# Patient Record
Sex: Male | Born: 2000 | Race: Black or African American | Hispanic: No | Marital: Single | Smoking: Never smoker
Health system: Southern US, Community
[De-identification: ages and names within clinical notes are randomized; demographics above are authoritative.]

## PROBLEM LIST (undated history)

## (undated) DIAGNOSIS — J45909 Unspecified asthma, uncomplicated: Secondary | ICD-10-CM

---
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
## 2022-05-26 ENCOUNTER — Encounter (HOSPITAL_BASED_OUTPATIENT_CLINIC_OR_DEPARTMENT_OTHER): Payer: Self-pay

## 2022-05-26 ENCOUNTER — Emergency Department (HOSPITAL_BASED_OUTPATIENT_CLINIC_OR_DEPARTMENT_OTHER)
Admission: EM | Admit: 2022-05-26 | Discharge: 2022-05-26 | Disposition: A | Discharge status: Home / Self Care | Payer: 59 | Attending: Emergency Medicine | Admitting: Emergency Medicine

## 2022-05-26 ENCOUNTER — Other Ambulatory Visit: Payer: Self-pay

## 2022-05-26 DIAGNOSIS — R519 Headache, unspecified: Secondary | ICD-10-CM | POA: Diagnosis present

## 2022-05-26 DIAGNOSIS — J45909 Unspecified asthma, uncomplicated: Secondary | ICD-10-CM | POA: Diagnosis not present

## 2022-05-26 MED ORDER — IBUPROFEN 800 MG PO TABS
800.0000 mg | ORAL_TABLET | Freq: Once | ORAL | Status: AC
  Administered 2022-05-26: 800 mg via ORAL
  Filled 2022-05-26: qty 1

## 2022-05-26 MED ORDER — ACETAMINOPHEN 500 MG PO TABS
1000.0000 mg | ORAL_TABLET | Freq: Once | ORAL | Status: AC
  Administered 2022-05-26: 1000 mg via ORAL
  Filled 2022-05-26: qty 2

## 2022-05-26 NOTE — ED Provider Notes (Signed)
[facility] EMERGENCY DEPT Provider Note  CSN: [id number] Arrival date & time: 05/26/22 1612  Chief Complaint(s) Headache  HPI Corey Madden is a 21 y.o. male with history of chronic headaches presenting to the emergency department with headaches.  He reports headaches in the right side of the face radiating to the jaw.  He denies any other symptoms such as vision changes, numbness, tingling, dental pain, facial swelling, lightheadedness, dizziness, syncope, neck pain, fevers or chills.  Has been present for 2 weeks.  He takes Motrin which helps with the symptoms.  Headache feels similar to chronic headaches.   Past Medical History Past Medical History:  Diagnosis Date   Asthma    There are no problems to display for this patient.  Home Medication(s) Prior to Admission medications   Medication Sig Start Date End Date Taking? Authorizing Provider  albuterol (PROVENTIL) (2.5 MG/3ML) 0.083% nebulizer solution Take 2.5 mg by nebulization every 6 (six) hours as needed. For shortness of breath    [provider]  ibuprofen (ADVIL,MOTRIN) 600 MG tablet Take 1 tablet (600 mg total) by mouth every 6 (six) hours as needed for mild pain. 01/13/17   Kristen Cardinal, NP                                                                                                                                    Past Surgical History No past surgical history on file. Family History No family history on file.  Social History Social History   Tobacco Use   Smoking status: Never   Smokeless tobacco: Never  Substance Use Topics   Alcohol use: Never   Drug use: Never   Allergies Patient has no known allergies.  Review of Systems Review of Systems  All other systems reviewed and are negative.   Physical Exam Vital Signs  I have reviewed the triage vital signs BP (!) 136/93   Pulse (!) 59   Temp 98 F (36.7 C) (Oral)   Resp 18   Ht 6\' 1"  (1.854 m)   Wt 66.7 kg   SpO2 100%    BMI 19.39 kg/m  Physical Exam Vitals and nursing note reviewed.  Constitutional:      General: He is not in acute distress.    Appearance: Normal appearance.  HENT:     Mouth/Throat:     Mouth: Mucous membranes are moist.  Eyes:     Conjunctiva/sclera: Conjunctivae normal.  Cardiovascular:     Rate and Rhythm: Normal rate and regular rhythm.  Pulmonary:     Effort: Pulmonary effort is normal. No respiratory distress.     Breath sounds: Normal breath sounds.  Abdominal:     General: Abdomen is flat.     Palpations: Abdomen is soft.     Tenderness: There is no abdominal tenderness.  Musculoskeletal:     Right lower leg: No edema.     Left lower leg:  No edema.  Skin:    General: Skin is warm and dry.     Capillary Refill: Capillary refill takes less than 2 seconds.  Neurological:     Mental Status: He is alert and oriented to person, place, and time. Mental status is at baseline.     Cranial Nerves: No dysarthria.     Comments: Cranial nerves II through XII intact, strength 5 out of 5 in the bilateral upper and lower extremities, no sensory deficit to light touch, no dysmetria on finger-nose-finger testing, ambulatory with steady gait.   Psychiatric:        Mood and Affect: Mood normal.        Behavior: Behavior normal.     ED Results and Treatments Labs (all labs ordered are listed, but only abnormal results are displayed) Labs Reviewed - No data to display                                                                                                                        Radiology No results found.  Pertinent labs & imaging results that were available during my care of the patient were reviewed by me and considered in my medical decision making (see MDM for details).  Medications Ordered in ED Medications - No data to display                                                                                                                                    Procedures Procedures  (including critical care time)  Medical Decision Making / ED Course   MDM:  21 year old male presenting to the emergency department with headache.  Patient overall well-appearing, exam nonfocal including normal neurologic exam.  Offered migraine cocktail given length of symptoms, patient refused.  Given headaches is consistent with prior headaches normal neurologic exam, low concern for acute intracranial process such as bleeding, mass effect.  Advised close follow-up with primary physician and will provide contact information for neurology referral given persistent headaches.  Patient is reportedly previously on a suppression medicine for headaches but has not been on this recently.  He is not sure what it is. Will discharge patient to home. All questions answered. Patient comfortable with plan of discharge. Return precautions discussed with patient and specified on the after visit summary.       Additional history obtained: -Additional history obtained from family -External records from outside source  obtained and reviewed including: Chart review including previous notes, labs, imaging, consultation notes   Lab Tests: -I ordered, reviewed, and interpreted labs.   The pertinent results include:   Labs Reviewed - No data to display    Medicines ordered and prescription drug management: No orders of the defined types were placed in this encounter.   -I have reviewed the patients home medicines and have made adjustments as needed    Reevaluation: After the interventions noted above, I reevaluated the patient and found that they have improved  Co morbidities that complicate the patient evaluation  Past Medical History:  Diagnosis Date   Asthma       Dispostion: Discharge    Final Clinical Impression(s) / ED Diagnoses Final diagnoses:  None     This chart was dictated using voice recognition software.  Despite best efforts to proofread,   errors can occur which can change the documentation meaning.    Lonell Grandchild, MD 05/26/22 2213

## 2022-05-26 NOTE — Discharge Instructions (Addendum)
We evaluated you in the emergency department for your headache.  Your neurologic exam was reassuring.  We did not find any other causes of headache or facial pain such as a dental infection.  Please follow-up closely with your primary doctor.  We have also provided a referral to neurology.  You can call them for follow-up as well.  Please return to the emergency department if you develop any new symptoms such as vomiting, vision changes numbness or tingling, dental pain, or any other symptoms.

## 2022-05-26 NOTE — ED Triage Notes (Signed)
Pt states for one week, he has been having a headache. Pt states it started on his forehead, now has gone down to jaw. Pt suffers frequent headaches.

## 2024-06-25 ENCOUNTER — Ambulatory Visit
Admission: EM | Admit: 2024-06-25 | Discharge: 2024-06-25 | Disposition: A | Discharge status: Home / Self Care | Attending: Family Medicine | Admitting: Family Medicine

## 2024-06-25 DIAGNOSIS — R519 Headache, unspecified: Secondary | ICD-10-CM

## 2024-06-25 MED ORDER — NAPROXEN 500 MG PO TABS: 500.0000 mg | ORAL_TABLET | Freq: Two times a day (BID) | ORAL | 0 refills | Status: AC | PRN

## 2024-06-25 MED ORDER — ACETAMINOPHEN 325 MG PO TABS
650.0000 mg | ORAL_TABLET | Freq: Once | ORAL | Status: AC
  Administered 2024-06-25: 650 mg via ORAL

## 2024-06-25 NOTE — ED Triage Notes (Signed)
 Pt present with a headache since Monday. Pt states he took tylenol  and had no relief. Pt states it hurts when he looks to the left and right. Denies blurred vision and sensitivity to the light.

## 2024-06-25 NOTE — ED Provider Notes (Signed)
 [facility]    CSN: [id number] Arrival date & time: 06/25/24  1752      History   Chief Complaint Chief Complaint  Patient presents with   Headache    HPI Corey Madden is a 23 y.o. male with a past medical history of asthma presents for headache.  Patient reports 4 days of an intermittent frontal headache that he describes as an aching headache.  He rates it as a 6 out of 10 and denies worst headache of life.  Denies any nausea/vomiting, dizziness, photophobia, visual changes, unilateral weakness, syncope.  No neck pain, fevers or chills.  Reports a remote history of headaches in the past but denies history of migraines.  Does have a family history of migraines with sister.  He states his headache resolved with Tylenol  and he last took that yesterday.  No other concerns at this time   Headache   Past Medical History:  Diagnosis Date   Asthma     There are no active problems to display for this patient.   History reviewed. No pertinent surgical history.     Home Medications    Prior to Admission medications   Medication Sig Start Date End Date Taking? Authorizing Provider  naproxen (NAPROSYN) 500 MG tablet Take 1 tablet (500 mg total) by mouth 2 (two) times daily as needed for headache. 06/25/24  Yes Jaynell Castagnola, Jodi R, NP  albuterol (PROVENTIL) (2.5 MG/3ML) 0.083% nebulizer solution Take 2.5 mg by nebulization every 6 (six) hours as needed. For shortness of breath    [provider]    Family History History reviewed. No pertinent family history.  Social History Social History   Tobacco Use   Smoking status: Never   Smokeless tobacco: Never  Vaping Use   Vaping status: Never Used  Substance Use Topics   Alcohol use: Never   Drug use: Never     Allergies   Patient has no known allergies.   Review of Systems Review of Systems  Neurological:  Positive for headaches.     Physical Exam Triage Vital Signs ED Triage Vitals [06/25/24  1813]  Encounter Vitals Group     BP 134/84     Girls Systolic BP Percentile      Girls Diastolic BP Percentile      Boys Systolic BP Percentile      Boys Diastolic BP Percentile      Pulse Rate (!) 105     Resp 17     Temp 98.8 F (37.1 C)     Temp Source Oral     SpO2 96 %     Weight      Height      Head Circumference      Peak Flow      Pain Score 6     Pain Loc      Pain Education      Exclude from Growth Chart    No data found.  Updated Vital Signs BP 134/84 (BP Location: Left Arm)   Pulse (!) 105   Temp 98.8 F (37.1 C) (Oral)   Resp 17   SpO2 96%   Visual Acuity Right Eye Distance:   Left Eye Distance:   Bilateral Distance:    Right Eye Near:   Left Eye Near:    Bilateral Near:     Physical Exam Vitals and nursing note reviewed.  Constitutional:      General: He is not in acute distress.  Appearance: Normal appearance. He is not ill-appearing.  HENT:     Head: Normocephalic and atraumatic.  Eyes:     Extraocular Movements: Extraocular movements intact.     Conjunctiva/sclera: Conjunctivae normal.     Pupils: Pupils are equal, round, and reactive to light.  Cardiovascular:     Rate and Rhythm: Normal rate.  Pulmonary:     Effort: Pulmonary effort is normal.  Skin:    General: Skin is warm and dry.  Neurological:     General: No focal deficit present.     Mental Status: He is alert and oriented to person, place, and time.     GCS: GCS eye subscore is 4. GCS verbal subscore is 5. GCS motor subscore is 6.     Cranial Nerves: No facial asymmetry.     Motor: No weakness.     Coordination: Romberg sign negative. Finger-Nose-Finger Test normal.     Gait: Tandem walk normal.     Comments: Strength 5 out of 5 bilateral upper extremity  Psychiatric:        Mood and Affect: Mood normal.        Behavior: Behavior normal.      UC Treatments / Results  Labs (all labs ordered are listed, but only abnormal results are displayed) Labs Reviewed - No  data to display  EKG   Radiology No results found.  Procedures Procedures (including critical care time)  Medications Ordered in UC Medications  acetaminophen  (TYLENOL ) tablet 650 mg (has no administration in time range)    Initial Impression / Assessment and Plan / UC Course  I have reviewed the triage vital signs and the nursing notes.  Pertinent labs & imaging results that were available during my care of the patient were reviewed by me and considered in my medical decision making (see chart for details).     Reviewed exam and symptoms with patient.  No red flags.  He declined Toradol injection in clinic.  Will do Tylenol  in clinic for headache and Rx naproxen to take as needed.  Discussed keeping a headache diary and taken to his PCP if symptoms persist.  ER precautions reviewed Final Clinical Impressions(s) / UC Diagnoses   Final diagnoses:  Acute nonintractable headache, unspecified headache type     Discharge Instructions      You may take naproxen twice daily as needed for your headache.  You were given Tylenol  while in the clinic.  Keep a headache diary and take this to your PCP if your symptoms persist.  Lots of rest and fluids and please go to the ER if you develop any worsening symptoms.  I hope you feel better soon!     ED Prescriptions     Medication Sig Dispense Auth. Provider   naproxen (NAPROSYN) 500 MG tablet Take 1 tablet (500 mg total) by mouth 2 (two) times daily as needed for headache. 10 tablet Gionni Freese, Jodi R, NP      PDMP not reviewed this encounter.   Loreda Myla SAUNDERS, NP 06/25/24 [phone]

## 2024-06-25 NOTE — Discharge Instructions (Signed)
 You may take naproxen twice daily as needed for your headache.  You were given Tylenol  while in the clinic.  Keep a headache diary and take this to your PCP if your symptoms persist.  Lots of rest and fluids and please go to the ER if you develop any worsening symptoms.  I hope you feel better soon!
